# Patient Record
Sex: Female | Born: 1965
Health system: Southern US, Community
[De-identification: ages and names within clinical notes are randomized; demographics above are authoritative.]

---

## 2002-04-25 ENCOUNTER — Encounter: Admission: RE | Admit: 2002-04-25 | Discharge: 2002-04-25 | Payer: Self-pay | Admitting: Neurology

## 2002-04-25 ENCOUNTER — Encounter: Payer: Self-pay | Admitting: Neurology

## 2002-07-09 ENCOUNTER — Encounter: Admission: RE | Admit: 2002-07-09 | Discharge: 2002-07-09 | Payer: Self-pay | Admitting: Neurology

## 2002-07-09 ENCOUNTER — Encounter: Payer: Self-pay | Admitting: Neurology

## 2003-03-13 ENCOUNTER — Encounter: Admission: RE | Admit: 2003-03-13 | Discharge: 2003-03-13 | Payer: Self-pay | Admitting: Unknown Physician Specialty

## 2003-03-13 ENCOUNTER — Encounter: Payer: Self-pay | Admitting: Unknown Physician Specialty

## 2004-03-05 ENCOUNTER — Encounter: Admission: RE | Admit: 2004-03-05 | Discharge: 2004-03-05 | Payer: Self-pay | Admitting: Obstetrics and Gynecology

## 2006-01-25 ENCOUNTER — Encounter: Admission: RE | Admit: 2006-01-25 | Discharge: 2006-01-25 | Payer: Self-pay | Admitting: Obstetrics and Gynecology

## 2007-01-29 ENCOUNTER — Encounter: Admission: RE | Admit: 2007-01-29 | Discharge: 2007-01-29 | Payer: Self-pay | Admitting: Obstetrics and Gynecology

## 2008-02-18 ENCOUNTER — Encounter: Admission: RE | Admit: 2008-02-18 | Discharge: 2008-02-18 | Payer: Self-pay | Admitting: Obstetrics and Gynecology

## 2009-03-17 ENCOUNTER — Encounter: Admission: RE | Admit: 2009-03-17 | Discharge: 2009-03-17 | Payer: Self-pay | Admitting: Obstetrics & Gynecology

## 2010-03-23 ENCOUNTER — Encounter: Admission: RE | Admit: 2010-03-23 | Discharge: 2010-03-23 | Payer: Self-pay | Admitting: *Deleted

## 2011-03-14 ENCOUNTER — Other Ambulatory Visit: Payer: Self-pay | Admitting: *Deleted

## 2011-03-14 DIAGNOSIS — Z1231 Encounter for screening mammogram for malignant neoplasm of breast: Secondary | ICD-10-CM

## 2011-04-05 ENCOUNTER — Ambulatory Visit: Payer: Self-pay

## 2011-04-26 ENCOUNTER — Ambulatory Visit: Payer: Self-pay

## 2011-05-03 ENCOUNTER — Ambulatory Visit: Payer: Self-pay

## 2011-05-24 ENCOUNTER — Ambulatory Visit
Admission: RE | Admit: 2011-05-24 | Discharge: 2011-05-24 | Disposition: A | Discharge status: Home / Self Care | Payer: 59 | Source: Ambulatory Visit | Attending: *Deleted | Admitting: *Deleted

## 2011-05-24 DIAGNOSIS — Z1231 Encounter for screening mammogram for malignant neoplasm of breast: Secondary | ICD-10-CM

## 2012-06-18 ENCOUNTER — Other Ambulatory Visit: Payer: Self-pay | Admitting: *Deleted

## 2012-06-18 DIAGNOSIS — Z1231 Encounter for screening mammogram for malignant neoplasm of breast: Secondary | ICD-10-CM

## 2012-08-07 ENCOUNTER — Ambulatory Visit (INDEPENDENT_AMBULATORY_CARE_PROVIDER_SITE_OTHER): Payer: 59

## 2012-08-07 DIAGNOSIS — Z1231 Encounter for screening mammogram for malignant neoplasm of breast: Secondary | ICD-10-CM

## 2014-02-12 ENCOUNTER — Other Ambulatory Visit: Payer: Self-pay | Admitting: *Deleted

## 2014-02-12 DIAGNOSIS — Z0000 Encounter for general adult medical examination without abnormal findings: Secondary | ICD-10-CM

## 2014-02-27 ENCOUNTER — Ambulatory Visit (INDEPENDENT_AMBULATORY_CARE_PROVIDER_SITE_OTHER): Payer: 59

## 2014-02-27 DIAGNOSIS — Z0000 Encounter for general adult medical examination without abnormal findings: Secondary | ICD-10-CM

## 2014-03-11 ENCOUNTER — Ambulatory Visit: Payer: 59

## 2015-02-02 ENCOUNTER — Other Ambulatory Visit: Payer: Self-pay | Admitting: *Deleted

## 2015-02-02 DIAGNOSIS — Z1239 Encounter for other screening for malignant neoplasm of breast: Secondary | ICD-10-CM

## 2015-03-04 ENCOUNTER — Ambulatory Visit (INDEPENDENT_AMBULATORY_CARE_PROVIDER_SITE_OTHER): Payer: PRIVATE HEALTH INSURANCE

## 2015-03-04 DIAGNOSIS — Z1239 Encounter for other screening for malignant neoplasm of breast: Secondary | ICD-10-CM

## 2015-03-04 DIAGNOSIS — Z1231 Encounter for screening mammogram for malignant neoplasm of breast: Secondary | ICD-10-CM

## 2016-05-31 ENCOUNTER — Other Ambulatory Visit (HOSPITAL_BASED_OUTPATIENT_CLINIC_OR_DEPARTMENT_OTHER): Payer: Self-pay | Admitting: *Deleted

## 2016-05-31 DIAGNOSIS — Z1231 Encounter for screening mammogram for malignant neoplasm of breast: Secondary | ICD-10-CM

## 2016-07-07 ENCOUNTER — Ambulatory Visit (HOSPITAL_BASED_OUTPATIENT_CLINIC_OR_DEPARTMENT_OTHER)
Admission: RE | Admit: 2016-07-07 | Discharge: 2016-07-07 | Disposition: A | Discharge status: Home / Self Care | Payer: 59 | Source: Ambulatory Visit | Attending: *Deleted | Admitting: *Deleted

## 2016-07-07 DIAGNOSIS — Z1231 Encounter for screening mammogram for malignant neoplasm of breast: Secondary | ICD-10-CM | POA: Insufficient documentation

## 2016-07-12 ENCOUNTER — Other Ambulatory Visit: Payer: Self-pay | Admitting: *Deleted

## 2016-07-12 DIAGNOSIS — R928 Other abnormal and inconclusive findings on diagnostic imaging of breast: Secondary | ICD-10-CM

## 2016-07-15 ENCOUNTER — Ambulatory Visit
Admission: RE | Admit: 2016-07-15 | Discharge: 2016-07-15 | Disposition: A | Discharge status: Home / Self Care | Payer: 59 | Source: Ambulatory Visit | Attending: *Deleted | Admitting: *Deleted

## 2016-07-15 ENCOUNTER — Other Ambulatory Visit: Payer: Self-pay | Admitting: *Deleted

## 2016-07-15 DIAGNOSIS — R928 Other abnormal and inconclusive findings on diagnostic imaging of breast: Secondary | ICD-10-CM

## 2016-07-18 ENCOUNTER — Other Ambulatory Visit: Payer: Self-pay | Admitting: *Deleted

## 2016-07-18 DIAGNOSIS — R928 Other abnormal and inconclusive findings on diagnostic imaging of breast: Secondary | ICD-10-CM

## 2016-07-19 ENCOUNTER — Ambulatory Visit
Admission: RE | Admit: 2016-07-19 | Discharge: 2016-07-19 | Disposition: A | Discharge status: Home / Self Care | Payer: 59 | Source: Ambulatory Visit | Attending: *Deleted | Admitting: *Deleted

## 2016-07-19 DIAGNOSIS — R928 Other abnormal and inconclusive findings on diagnostic imaging of breast: Secondary | ICD-10-CM

## 2016-10-21 ENCOUNTER — Other Ambulatory Visit: Payer: Self-pay | Admitting: *Deleted

## 2016-10-21 DIAGNOSIS — N63 Unspecified lump in unspecified breast: Secondary | ICD-10-CM

## 2016-11-01 ENCOUNTER — Other Ambulatory Visit: Payer: Self-pay | Admitting: *Deleted

## 2016-11-01 ENCOUNTER — Ambulatory Visit
Admission: RE | Admit: 2016-11-01 | Discharge: 2016-11-01 | Disposition: A | Discharge status: Home / Self Care | Payer: 59 | Source: Ambulatory Visit | Attending: *Deleted | Admitting: *Deleted

## 2016-11-01 DIAGNOSIS — N63 Unspecified lump in unspecified breast: Secondary | ICD-10-CM

## 2016-11-01 DIAGNOSIS — N6489 Other specified disorders of breast: Secondary | ICD-10-CM

## 2020-10-17 ENCOUNTER — Emergency Department (INDEPENDENT_AMBULATORY_CARE_PROVIDER_SITE_OTHER): Payer: 59

## 2020-10-17 ENCOUNTER — Encounter: Payer: Self-pay | Admitting: Emergency Medicine

## 2020-10-17 ENCOUNTER — Other Ambulatory Visit: Payer: Self-pay

## 2020-10-17 ENCOUNTER — Emergency Department (INDEPENDENT_AMBULATORY_CARE_PROVIDER_SITE_OTHER)
Admission: EM | Admit: 2020-10-17 | Discharge: 2020-10-17 | Disposition: A | Discharge status: Home / Self Care | Payer: 59 | Source: Home / Self Care

## 2020-10-17 ENCOUNTER — Ambulatory Visit: Payer: Self-pay

## 2020-10-17 DIAGNOSIS — M546 Pain in thoracic spine: Secondary | ICD-10-CM | POA: Diagnosis not present

## 2020-10-17 DIAGNOSIS — R000 Tachycardia, unspecified: Secondary | ICD-10-CM | POA: Diagnosis not present

## 2020-10-17 DIAGNOSIS — U071 COVID-19: Secondary | ICD-10-CM

## 2020-10-17 DIAGNOSIS — R52 Pain, unspecified: Secondary | ICD-10-CM

## 2020-10-17 DIAGNOSIS — J0110 Acute frontal sinusitis, unspecified: Secondary | ICD-10-CM | POA: Diagnosis not present

## 2020-10-17 LAB — POCT CBC W AUTO DIFF (K'VILLE URGENT CARE)

## 2020-10-17 LAB — POCT URINALYSIS DIP (MANUAL ENTRY)
Glucose, UA: NEGATIVE mg/dL
Leukocytes, UA: NEGATIVE
Nitrite, UA: NEGATIVE
Protein Ur, POC: NEGATIVE mg/dL
Spec Grav, UA: 1.02 (ref 1.010–1.025)
Urobilinogen, UA: 0.2 E.U./dL
pH, UA: 6 (ref 5.0–8.0)

## 2020-10-17 MED ORDER — SALINE SPRAY 0.65 % NA SOLN: 1.0000 | NASAL | 0 refills | Status: AC | PRN

## 2020-10-17 MED ORDER — DOXYCYCLINE HYCLATE 100 MG PO CAPS: 100.0000 mg | ORAL_CAPSULE | Freq: Two times a day (BID) | ORAL | 0 refills | Status: AC

## 2020-10-17 MED ORDER — FLUTICASONE PROPIONATE 50 MCG/ACT NA SUSP: 2.0000 | Freq: Every day | NASAL | 2 refills | Status: AC

## 2020-10-17 NOTE — ED Notes (Signed)
D-dimer cancelled per provider when [facility] arrived to pick up - sample not sent

## 2020-10-17 NOTE — ED Triage Notes (Signed)
Patient diagnosed with COVID on Sunday, extreme body aches and headache.  Patient is having back pain, requesting her lungs to be listened too.  Also possible sinus infection, blowing straight blood through nose.  No SOB, some cough, taking Mucinex and Tylenol.

## 2020-10-17 NOTE — Discharge Instructions (Addendum)
  Your symptoms appear to be a secondary bacterial sinus infection from the recent COVID virus infection. Please take antibiotics as prescribed and be sure to complete entire course even if you start to feel better to ensure infection does not come back. You ay also run a humidifier at night and use the nasal saline and/or sinus rinses to help with sinus pain and pressure.  COVID increases your risk of blood clots. If you develop worsening headache, dizziness, change in vision, severe ear pain, chest pain, trouble breathing, or other new concerning symptoms develop, call 911 or go to the closest hospital for further evaluation and treatment.   If symptoms are not worsening but also not improving, please call your primary care provider Monday to schedule a follow up appointment for recheck of symptoms next week.

## 2020-10-17 NOTE — ED Provider Notes (Signed)
[facility]    CSN: [id number] Arrival date & time: 10/17/20  0818      History   Chief Complaint Chief Complaint  Patient presents with  . Generalized Body Aches    HPI Elizabeth Duarte is a 54 y.o. female.   HPI  Elizabeth Duarte is a 54 y.o. female presenting to UC with c/o back pain and blood in her nasal congestion when blowing her nose. Pt had a positive at home COVID test on Sunday, 10/04/20. She reports having severe HA and body aches " to the bone" that have gradually improved but she is concerned the back pain has continued and blood in her nasal congestion. Hx of sinus infections in the past. Denies chest pain or SOB but is requesting her lungs be listened to.  Low grade fever at home. No n/v/d. Denies change in vision, dizziness, ear pain or sore throat.  No hx of clots. No leg pain or swelling. She has not received the COVID vaccine and has not been evaluated by a medical provider since her POSITIVE at home COVID test.   History reviewed. No pertinent past medical history.  There are no problems to display for this patient.   History reviewed. No pertinent surgical history.  OB History   No obstetric history on file.      Home Medications    Prior to Admission medications   Medication Sig Start Date End Date Taking? Authorizing Provider  famotidine (PEPCID) 20 MG tablet Take by mouth.   Yes [provider]  montelukast (SINGULAIR) 10 MG tablet Take by mouth. 06/18/20  Yes [provider]  Multiple Vitamin (MULTIVITAMIN) tablet Take 1 tablet by mouth daily.   Yes [provider]  Multiple Vitamins-Minerals (ZINC PO) Take by mouth.   Yes [provider]  VITAMIN D PO Take by mouth.   Yes [provider]  doxycycline (VIBRAMYCIN) 100 MG capsule Take 1 capsule (100 mg total) by mouth 2 (two) times daily. 10/17/20   Lurene Shadow, PA-C  fluticasone (FLONASE) 50 MCG/ACT nasal spray Place 2 sprays into both nostrils  daily. 10/17/20   Lurene Shadow, PA-C  sodium chloride (OCEAN) 0.65 % SOLN nasal spray Place 1 spray into both nostrils as needed for congestion. 10/17/20   Lurene Shadow, PA-C    Family History No family history on file.  Social History Social History   Tobacco Use  . Smoking status: Never Smoker  . Smokeless tobacco: Never Used  Substance Use Topics  . Alcohol use: Not on file  . Drug use: Not on file     Allergies   Penicillins and Azithromycin   Review of Systems Review of Systems  Constitutional: Positive for fever. Negative for chills.  HENT: Positive for congestion, nosebleeds, sinus pressure and sinus pain. Negative for ear pain, sore throat, trouble swallowing and voice change.   Respiratory: Negative for cough and shortness of breath.   Cardiovascular: Negative for chest pain and palpitations.  Gastrointestinal: Negative for abdominal pain, diarrhea, nausea and vomiting.  Genitourinary: Positive for flank pain. Negative for dysuria, frequency, hematuria, pelvic pain and urgency.  Musculoskeletal: Positive for arthralgias, back pain and myalgias.  Skin: Negative for rash.  Neurological: Positive for headaches. Negative for light-headedness.  All other systems reviewed and are negative.    Physical Exam Triage Vital Signs ED Triage Vitals [10/17/20 0843]  Enc Vitals Group     BP 108/74     Pulse Rate Marland Kitchen)  116     Resp      Temp 99.9 F (37.7 C)     Temp Source Oral     SpO2 97 %     Weight      Height      Head Circumference      Peak Flow      Pain Score 5     Pain Loc      Pain Edu?      Excl. in GC?    No data found.  Updated Vital Signs BP 130/79 (BP Location: Left Arm)   Pulse (!) 119   Temp 99.9 F (37.7 C) (Oral)   LMP 07/18/2012   SpO2 98%   Visual Acuity Right Eye Distance:   Left Eye Distance:   Bilateral Distance:    Right Eye Near:   Left Eye Near:    Bilateral Near:     Physical Exam Vitals and nursing note reviewed.    Constitutional:      General: She is not in acute distress.    Appearance: Normal appearance. She is well-developed. She is not ill-appearing, toxic-appearing or diaphoretic.  HENT:     Head: Normocephalic and atraumatic.     Right Ear: Tympanic membrane and ear canal normal.     Left Ear: Tympanic membrane and ear canal normal.     Nose: Mucosal edema present.     Right Sinus: Frontal sinus tenderness present. No maxillary sinus tenderness.     Left Sinus: Frontal sinus tenderness present. No maxillary sinus tenderness.     Mouth/Throat:     Lips: Pink.     Mouth: Mucous membranes are moist.     Pharynx: Oropharynx is clear. Uvula midline. No pharyngeal swelling, oropharyngeal exudate, posterior oropharyngeal erythema or uvula swelling.  Cardiovascular:     Rate and Rhythm: Regular rhythm. Tachycardia present.  Pulmonary:     Effort: Pulmonary effort is normal. No respiratory distress.     Breath sounds: Normal breath sounds. No stridor. No wheezing, rhonchi or rales.  Abdominal:     General: There is no distension.     Palpations: Abdomen is soft.     Tenderness: There is no abdominal tenderness. There is no right CVA tenderness or left CVA tenderness.  Musculoskeletal:        General: Normal range of motion.     Cervical back: Normal range of motion and neck supple. No tenderness.  Lymphadenopathy:     Cervical: No cervical adenopathy.  Skin:    General: Skin is warm and dry.  Neurological:     Mental Status: She is alert and oriented to person, place, and time.  Psychiatric:        Behavior: Behavior normal.      UC Treatments / Results  Labs (all labs ordered are listed, but only abnormal results are displayed) Labs Reviewed  POCT URINALYSIS DIP (MANUAL ENTRY) - Abnormal; Notable for the following components:      Result Value   Bilirubin, UA small (*)    Ketones, POC UA moderate (40) (*)    Blood, UA trace-lysed (*)    All other components within normal limits   COMPLETE METABOLIC PANEL WITH GFR  POCT CBC W AUTO DIFF ([facility])    EKG Date/Time:10/17/2020   10:06/05 Ventricular Rate: 100 PR Interval: 126 QRS Duration: 82 QT Interval: 342 QTC Calculation: 441 P-R-T axes: 61   65   20 Text Interpretation: normal sinus rhythm, nonspecific ST abnormality,  abnormal ECG     Radiology DG Chest 2 View  Result Date: 10/17/2020 CLINICAL DATA:  Fever and suspected COVID-19 positive based on home test. EXAM: CHEST - 2 VIEW COMPARISON:  None. FINDINGS: Lungs are clear. Heart size and pulmonary vascularity are normal. No adenopathy. No bone lesions. IMPRESSION: Lungs clear.  Cardiac silhouette normal. Electronically Signed   By: Bretta Bang III M.D.   On: 10/17/2020 09:11    Procedures Procedures (including critical care time)  Medications Ordered in UC Medications - No data to display  Initial Impression / Assessment and Plan / UC Course  I have reviewed the triage vital signs and the nursing notes.  Pertinent labs & imaging results that were available during my care of the patient were reviewed by me and considered in my medical decision making (see chart for details).     Pt tachycardic, denies chest pain or SOB. Some concern for blood clot due to recent positive COVID Discussed pt with Dr. Leonides Grills  D-dimer was initially ordered, advised will likely be elevated given recent infection rather than definite clot. Will cancel D-dimer.  EKG: normal sinus rhythm, nonspecific ST abnormality  Increase fluid intake.  Pt given strict instructions to call 911 or go to closest hospital if symptoms worsening or new symptoms develop- severe headache, change in vision, chest pain, trouble breathing.  Pt to f/u with PCP later this week if not improving Pt verbalized understanding and agreement with tx plan. AVS given   Final Clinical Impressions(s) / UC Diagnoses   Final diagnoses:  COVID  Body aches  Acute non-recurrent  frontal sinusitis  Acute bilateral thoracic back pain  Tachycardia     Discharge Instructions      Your symptoms appear to be a secondary bacterial sinus infection from the recent COVID virus infection. Please take antibiotics as prescribed and be sure to complete entire course even if you start to feel better to ensure infection does not come back. You ay also run a humidifier at night and use the nasal saline and/or sinus rinses to help with sinus pain and pressure.  COVID increases your risk of blood clots. If you develop worsening headache, dizziness, change in vision, severe ear pain, chest pain, trouble breathing, or other new concerning symptoms develop, call 911 or go to the closest hospital for further evaluation and treatment.   If symptoms are not worsening but also not improving, please call your primary care provider Monday to schedule a follow up appointment for recheck of symptoms next week.     ED Prescriptions    Medication Sig Dispense Auth. Provider   doxycycline (VIBRAMYCIN) 100 MG capsule Take 1 capsule (100 mg total) by mouth 2 (two) times daily. 20 capsule Waylan Rocher O, PA-C   sodium chloride (OCEAN) 0.65 % SOLN nasal spray Place 1 spray into both nostrils as needed for congestion. 60 mL Brylee Mcgreal O, PA-C   fluticasone (FLONASE) 50 MCG/ACT nasal spray Place 2 sprays into both nostrils daily. 16 g Lurene Shadow, [place]     PDMP not reviewed this encounter.   Lurene Shadow, PA-C 10/17/20 1018

## 2020-10-17 NOTE — ED Notes (Signed)
Stat pick up called into [facility] for d-dimer lab- room temp- confirmation # [id number]

## 2020-10-18 ENCOUNTER — Telehealth: Payer: Self-pay | Admitting: Emergency Medicine

## 2020-10-18 ENCOUNTER — Emergency Department
Admission: EM | Admit: 2020-10-18 | Discharge: 2020-10-18 | Disposition: A | Discharge status: Home / Self Care | Payer: 59 | Source: Home / Self Care

## 2020-10-18 NOTE — Telephone Encounter (Signed)
Elizabeth Duarte called to see if she needed to be seen again- pt was seen yesterday at [facility]. Pt has had 2 doses of antibiotic & wonders if she needs a steroid because her cough was drier - pt was diagnosed w/ COVID 1 week ago at home. Pt instructed to take Delsym for cough at night and to increase her fluids w/ water, popsicles, & soup. Pt instructed to go to ER if cough is worse or she has SOB or fever.

## 2020-10-19 LAB — D-DIMER, QUANTITATIVE

## 2020-10-19 LAB — COMPLETE METABOLIC PANEL WITH GFR
AG Ratio: 1.6 (calc) (ref 1.0–2.5)
ALT: 21 U/L (ref 6–29)
AST: 25 U/L (ref 10–35)
Albumin: 4.6 g/dL (ref 3.6–5.1)
Alkaline phosphatase (APISO): 74 U/L (ref 37–153)
BUN: 9 mg/dL (ref 7–25)
CO2: 26 mmol/L (ref 20–32)
Calcium: 9.2 mg/dL (ref 8.6–10.4)
Chloride: 97 mmol/L — ABNORMAL LOW (ref 98–110)
Creat: 0.61 mg/dL (ref 0.50–1.05)
GFR, Est African American: 119 mL/min/{1.73_m2} (ref >=60)
GFR, Est Non African American: 103 mL/min/{1.73_m2} (ref >=60)
Globulin: 2.8 g/dL (calc) (ref 1.9–3.7)
Glucose, Bld: 111 mg/dL — ABNORMAL HIGH (ref 65–99)
Potassium: 4.3 mmol/L (ref 3.5–5.3)
Sodium: 135 mmol/L (ref 135–146)
Total Bilirubin: 0.4 mg/dL (ref 0.2–1.2)
Total Protein: 7.4 g/dL (ref 6.1–8.1)

## 2021-11-29 IMAGING — DX DG CHEST 2V
2 series · 2 of 2 positions shown · non-contrast
Comparison: None.

CLINICAL DATA: Fever and suspected [id number] positive based on home
test.

EXAM:
CHEST - 2 VIEW

[chest pa]
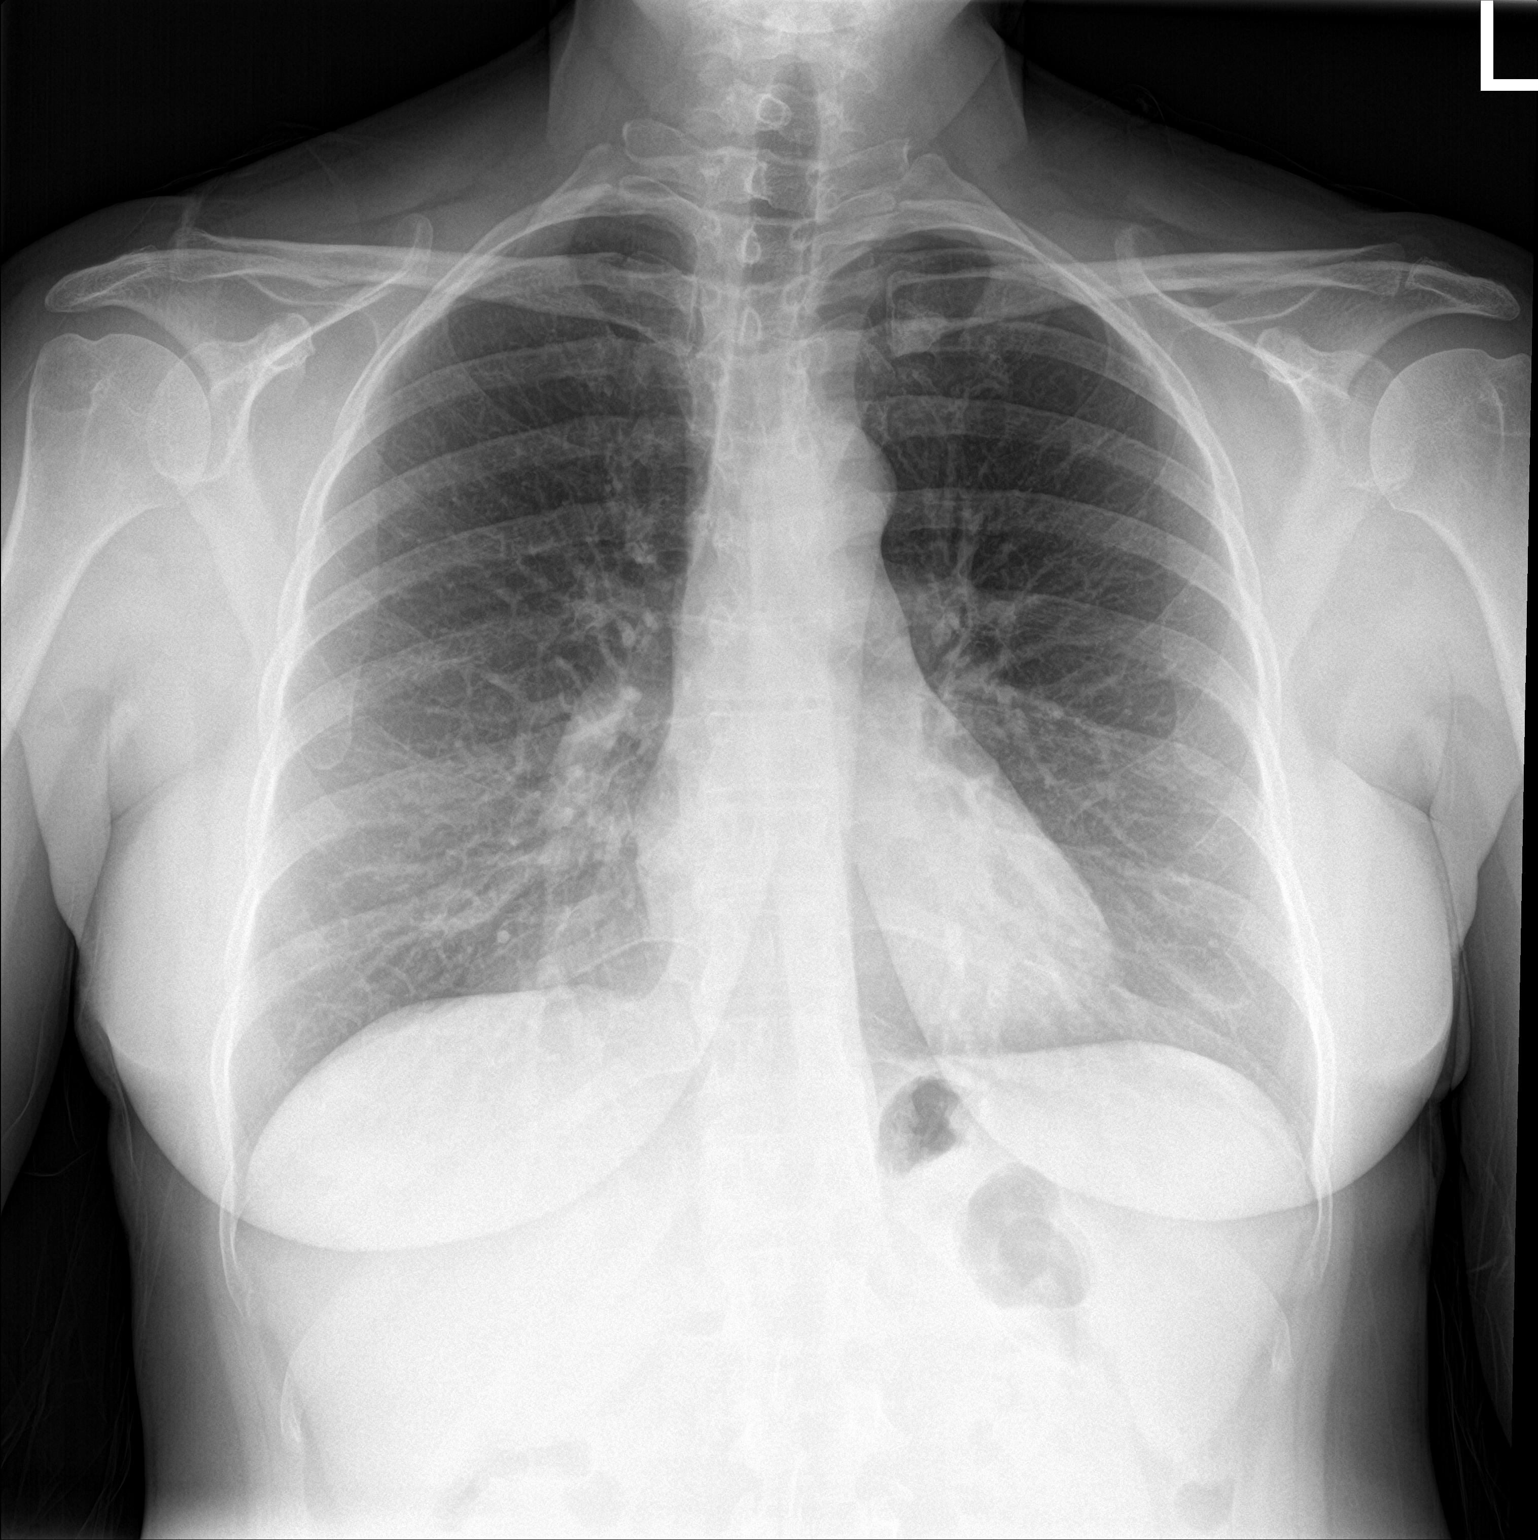

[chest lat]
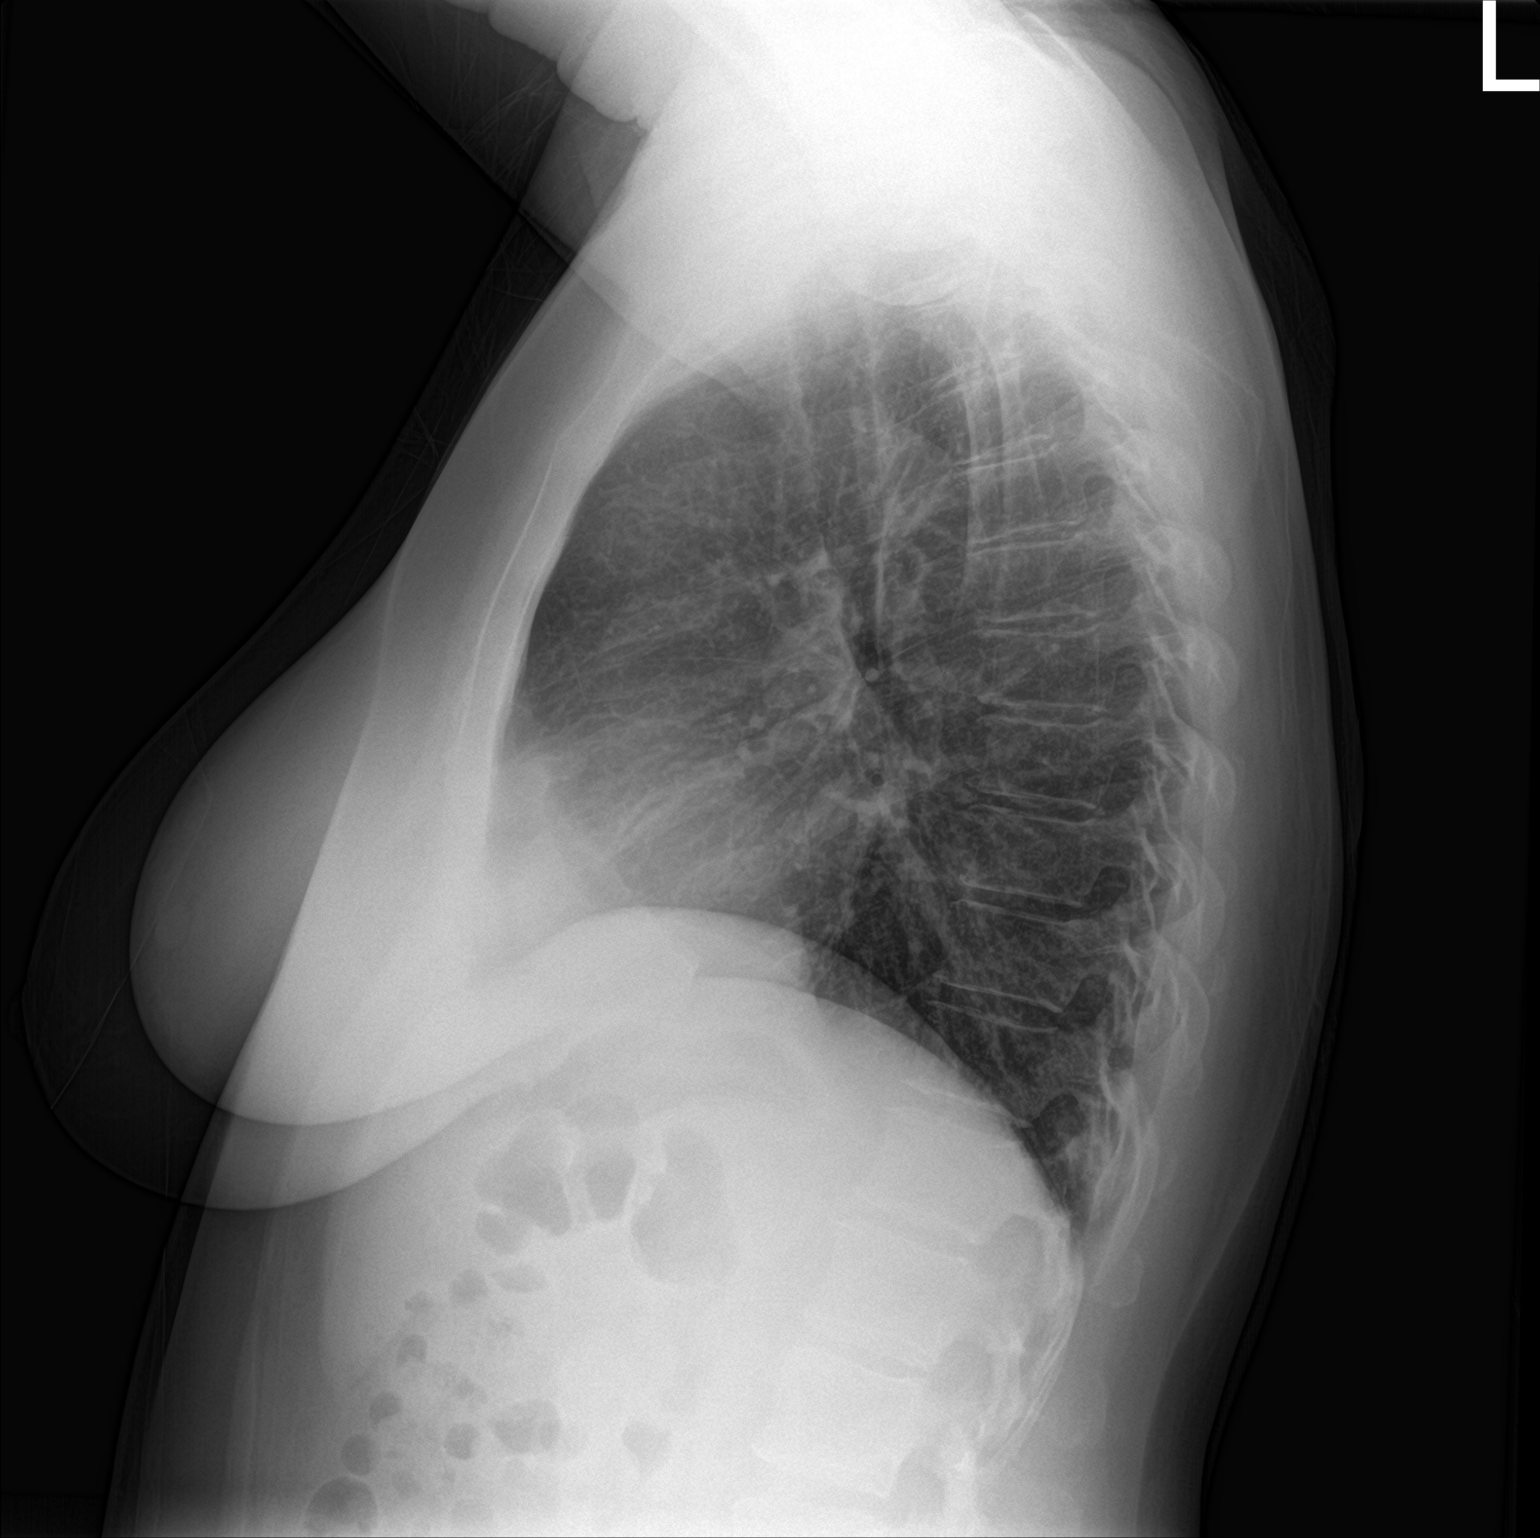

[2 of 2 positions shown; findings below may reference images not displayed]

FINDINGS: Lungs are clear. Heart size and pulmonary vascularity are normal. No
adenopathy. No bone lesions.
IMPRESSION: Lungs clear.  Cardiac silhouette normal.
